# Patient Record
Sex: Female | Born: 1965 | Race: White | Hispanic: No | Smoking: Never smoker
Health system: Southern US, Community
[De-identification: ages and names within clinical notes are randomized; demographics above are authoritative.]

## PROBLEM LIST (undated history)

## (undated) DIAGNOSIS — M7710 Lateral epicondylitis, unspecified elbow: Secondary | ICD-10-CM

## (undated) HISTORY — PX: DIAGNOSTIC LAPAROSCOPY: SUR761

## (undated) HISTORY — PX: NASAL SINUS SURGERY: SHX719

---

## 2006-03-23 ENCOUNTER — Ambulatory Visit: Payer: Self-pay | Admitting: Obstetrics and Gynecology

## 2006-07-12 ENCOUNTER — Ambulatory Visit: Payer: Self-pay | Admitting: Internal Medicine

## 2009-05-16 ENCOUNTER — Ambulatory Visit: Payer: Self-pay | Admitting: Obstetrics and Gynecology

## 2010-05-19 ENCOUNTER — Ambulatory Visit: Payer: Self-pay | Admitting: Obstetrics and Gynecology

## 2010-07-16 ENCOUNTER — Ambulatory Visit: Payer: Self-pay | Admitting: Internal Medicine

## 2010-07-31 ENCOUNTER — Encounter: Payer: Self-pay | Admitting: Internal Medicine

## 2010-08-19 ENCOUNTER — Encounter: Payer: Self-pay | Admitting: Internal Medicine

## 2011-06-01 ENCOUNTER — Ambulatory Visit: Payer: Self-pay | Admitting: Obstetrics and Gynecology

## 2012-02-18 ENCOUNTER — Other Ambulatory Visit: Payer: Self-pay | Admitting: Physician Assistant

## 2012-02-18 LAB — COMPREHENSIVE METABOLIC PANEL
Albumin: 3.7 g/dL (ref 3.4–5.0)
BUN: 11 mg/dL (ref 7–18)
Bilirubin,Total: 0.4 mg/dL (ref 0.2–1.0)
Co2: 27 mmol/L (ref 21–32)
Creatinine: 0.78 mg/dL (ref 0.60–1.30)
EGFR (Non-African Amer.): >60
Glucose: 81 mg/dL (ref 65–99)
Osmolality: 276 (ref 275–301)
Potassium: 3.7 mmol/L (ref 3.5–5.1)
Sodium: 139 mmol/L (ref 136–145)
Total Protein: 7.4 g/dL (ref 6.4–8.2)

## 2012-02-18 LAB — CBC WITH DIFFERENTIAL/PLATELET
Eosinophil #: 0.2 10*3/uL (ref 0.0–0.7)
HGB: 14 g/dL (ref 12.0–16.0)
Lymphocyte #: 1.9 10*3/uL (ref 1.0–3.6)
MCH: 29.3 pg (ref 26.0–34.0)
MCHC: 33.8 g/dL (ref 32.0–36.0)
MCV: 87 fL (ref 80–100)
Monocyte #: 0.4 x10 3/mm (ref 0.2–0.9)
Neutrophil #: 3.3 10*3/uL (ref 1.4–6.5)
Neutrophil %: 57.5 %
RDW: 13 % (ref 11.5–14.5)
WBC: 5.7 10*3/uL (ref 3.6–11.0)

## 2012-02-18 LAB — HEMOGLOBIN A1C: Hemoglobin A1C: 5 % (ref 4.2–6.3)

## 2012-06-27 ENCOUNTER — Ambulatory Visit: Payer: Self-pay | Admitting: General Practice

## 2012-07-20 ENCOUNTER — Ambulatory Visit: Payer: Self-pay | Admitting: Obstetrics and Gynecology

## 2013-04-04 ENCOUNTER — Other Ambulatory Visit: Payer: Self-pay | Admitting: Physician Assistant

## 2013-11-24 ENCOUNTER — Ambulatory Visit: Payer: Self-pay | Admitting: Obstetrics and Gynecology

## 2015-01-04 ENCOUNTER — Ambulatory Visit: Payer: Self-pay | Admitting: Obstetrics and Gynecology

## 2015-11-19 ENCOUNTER — Encounter: Payer: Self-pay | Admitting: Physician Assistant

## 2015-11-19 ENCOUNTER — Ambulatory Visit: Payer: Self-pay | Admitting: Physician Assistant

## 2015-11-19 VITALS — BP 130/94 | HR 100 | Temp 98.7°F

## 2015-11-19 DIAGNOSIS — J209 Acute bronchitis, unspecified: Secondary | ICD-10-CM

## 2015-11-19 MED ORDER — LEVOFLOXACIN 500 MG PO TABS: 500.0000 mg | ORAL_TABLET | Freq: Every day | ORAL | Status: AC

## 2015-11-19 NOTE — Progress Notes (Signed)
S: C/o cough and congestion for 4 days, chest is sore from coughing, some fever, chills, mucus is green, feels like glass in back of her throat, some facial and dental pain,   denies cardiac type chest pain or sob, v/d, abd pain Remainder ros neg  O: vitals wnl, nad, tms clear, throat injected, neck supple no lymph, lungs c t a, cv rrr, neuro intact, cough is tight and congested  A:  Acute bronchitis   P:  rx medication:  levaquin 500mg  qd x 10d, ;  use otc meds, tylenol or motrin as needed for fever/chills, return if not better in 3 -5 days, return earlier if worsening

## 2015-12-02 ENCOUNTER — Other Ambulatory Visit: Payer: Self-pay | Admitting: Obstetrics and Gynecology

## 2015-12-02 DIAGNOSIS — Z01419 Encounter for gynecological examination (general) (routine) without abnormal findings: Secondary | ICD-10-CM | POA: Diagnosis not present

## 2015-12-02 DIAGNOSIS — Z1211 Encounter for screening for malignant neoplasm of colon: Secondary | ICD-10-CM | POA: Diagnosis not present

## 2015-12-02 DIAGNOSIS — Z1231 Encounter for screening mammogram for malignant neoplasm of breast: Secondary | ICD-10-CM | POA: Diagnosis not present

## 2015-12-16 ENCOUNTER — Other Ambulatory Visit
Admission: RE | Admit: 2015-12-16 | Discharge: 2015-12-16 | Disposition: A | Discharge status: Home / Self Care | Payer: 59 | Source: Ambulatory Visit | Attending: Obstetrics and Gynecology | Admitting: Obstetrics and Gynecology

## 2015-12-16 DIAGNOSIS — Z87898 Personal history of other specified conditions: Secondary | ICD-10-CM | POA: Diagnosis not present

## 2015-12-16 DIAGNOSIS — D259 Leiomyoma of uterus, unspecified: Secondary | ICD-10-CM | POA: Diagnosis not present

## 2015-12-16 DIAGNOSIS — E669 Obesity, unspecified: Secondary | ICD-10-CM | POA: Insufficient documentation

## 2015-12-16 LAB — CBC WITH DIFFERENTIAL/PLATELET
BASOS ABS: 0 10*3/uL (ref 0–0.1)
BASOS PCT: 0 %
EOS PCT: 3 %
Eosinophils Absolute: 0.2 10*3/uL (ref 0–0.7)
HCT: 41.8 % (ref 35.0–47.0)
Hemoglobin: 14.4 g/dL (ref 12.0–16.0)
Lymphocytes Relative: 32 %
Lymphs Abs: 2 10*3/uL (ref 1.0–3.6)
MCH: 29 pg (ref 26.0–34.0)
MCHC: 34.4 g/dL (ref 32.0–36.0)
MCV: 84.4 fL (ref 80.0–100.0)
MONO ABS: 0.4 10*3/uL (ref 0.2–0.9)
Monocytes Relative: 6 %
Neutro Abs: 3.6 10*3/uL (ref 1.4–6.5)
Neutrophils Relative %: 59 %
PLATELETS: 239 10*3/uL (ref 150–440)
RBC: 4.95 MIL/uL (ref 3.80–5.20)
RDW: 13 % (ref 11.5–14.5)
WBC: 6.1 10*3/uL (ref 3.6–11.0)

## 2015-12-16 LAB — BASIC METABOLIC PANEL
Anion gap: 5 (ref 5–15)
BUN: 13 mg/dL (ref 6–20)
CALCIUM: 9 mg/dL (ref 8.9–10.3)
CO2: 29 mmol/L (ref 22–32)
Chloride: 107 mmol/L (ref 101–111)
Creatinine, Ser: 0.88 mg/dL (ref 0.44–1.00)
GFR calc Af Amer: >60 mL/min (ref >60)
GLUCOSE: 90 mg/dL (ref 65–99)
POTASSIUM: 4 mmol/L (ref 3.5–5.1)
SODIUM: 141 mmol/L (ref 135–145)

## 2015-12-16 LAB — TSH: TSH: 2.521 u[IU]/mL (ref 0.350–4.500)

## 2015-12-16 LAB — VITAMIN B12: VITAMIN B 12: 270 pg/mL (ref 180–914)

## 2015-12-17 ENCOUNTER — Other Ambulatory Visit: Payer: Self-pay | Admitting: Obstetrics and Gynecology

## 2015-12-17 DIAGNOSIS — Z1231 Encounter for screening mammogram for malignant neoplasm of breast: Secondary | ICD-10-CM

## 2016-01-07 ENCOUNTER — Other Ambulatory Visit: Payer: Self-pay | Admitting: Obstetrics and Gynecology

## 2016-01-07 ENCOUNTER — Ambulatory Visit
Admission: RE | Admit: 2016-01-07 | Discharge: 2016-01-07 | Disposition: A | Discharge status: Home / Self Care | Payer: 59 | Source: Ambulatory Visit | Attending: Obstetrics and Gynecology | Admitting: Obstetrics and Gynecology

## 2016-01-07 DIAGNOSIS — Z1231 Encounter for screening mammogram for malignant neoplasm of breast: Secondary | ICD-10-CM | POA: Diagnosis not present

## 2016-03-13 DIAGNOSIS — H5213 Myopia, bilateral: Secondary | ICD-10-CM | POA: Diagnosis not present

## 2016-06-09 IMAGING — MG MM SCREENING BREAST TOMO BILATERAL
9 of 12 series · 9 of 28 positions shown · non-contrast
Comparison: Previous exam(s).

CLINICAL DATA: Screening.

EXAM:
2D DIGITAL SCREENING BILATERAL MAMMOGRAM WITH CAD AND ADJUNCT TOMO

[L MLO synth-2D]
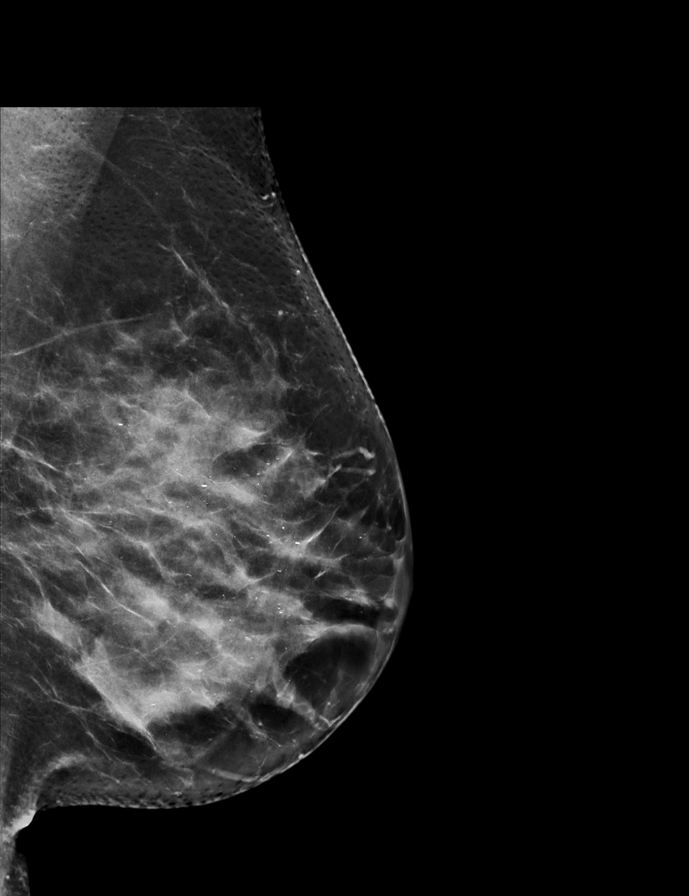

[L CC]
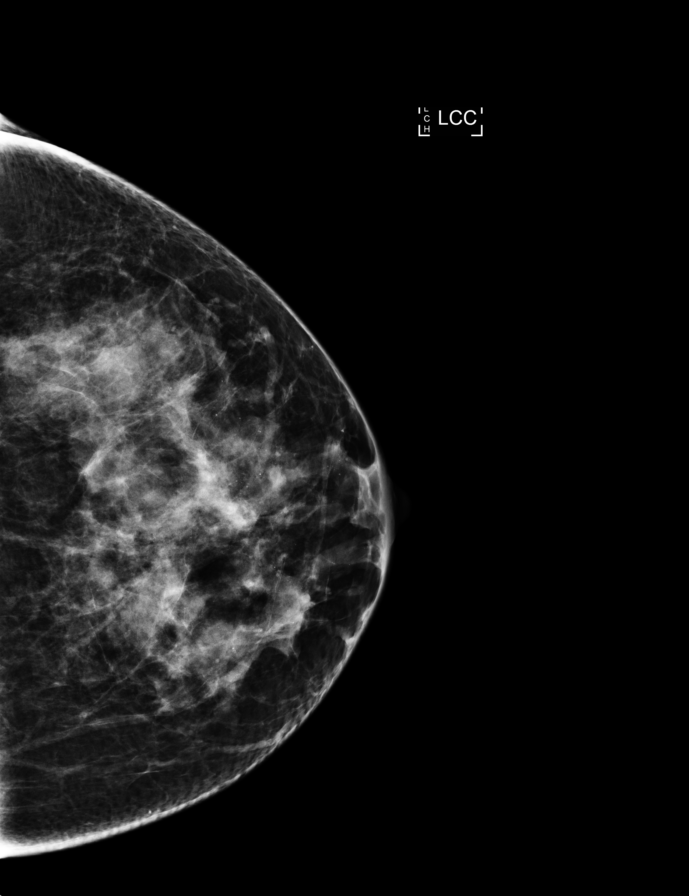

[R MLO synth-2D]
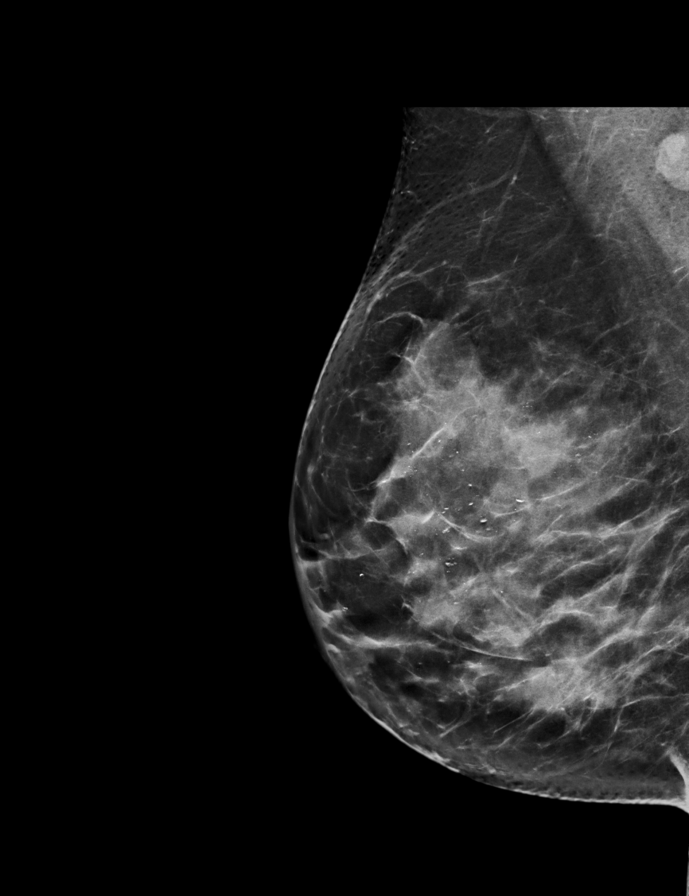

[R MLO]
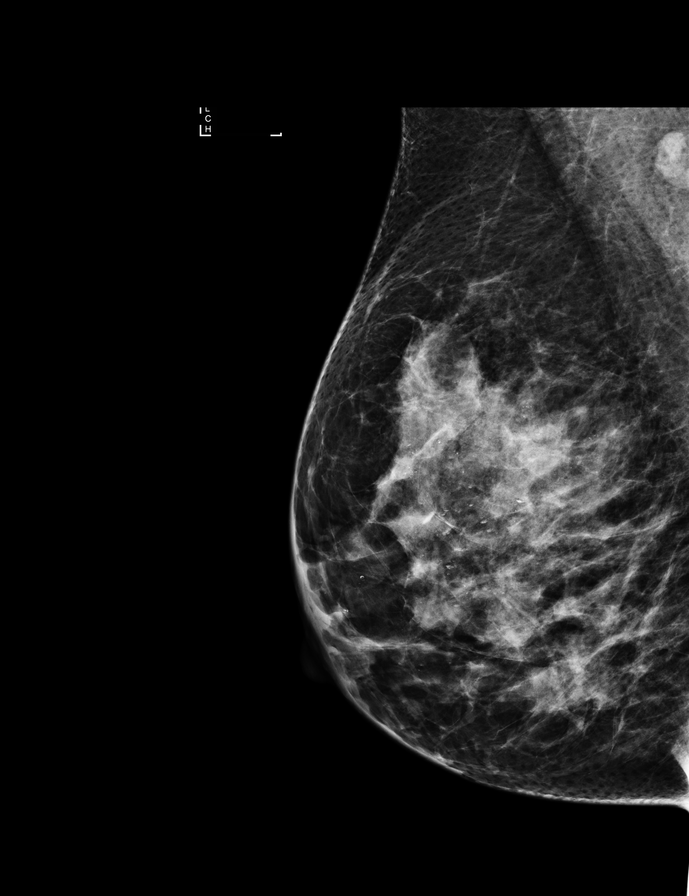

[L CC synth-2D]
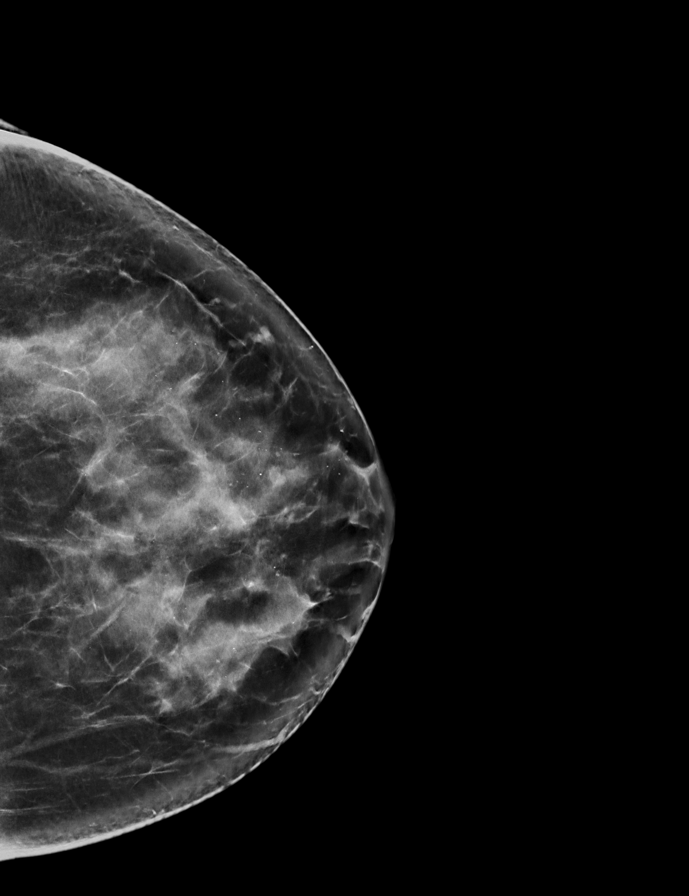

[L MLO]
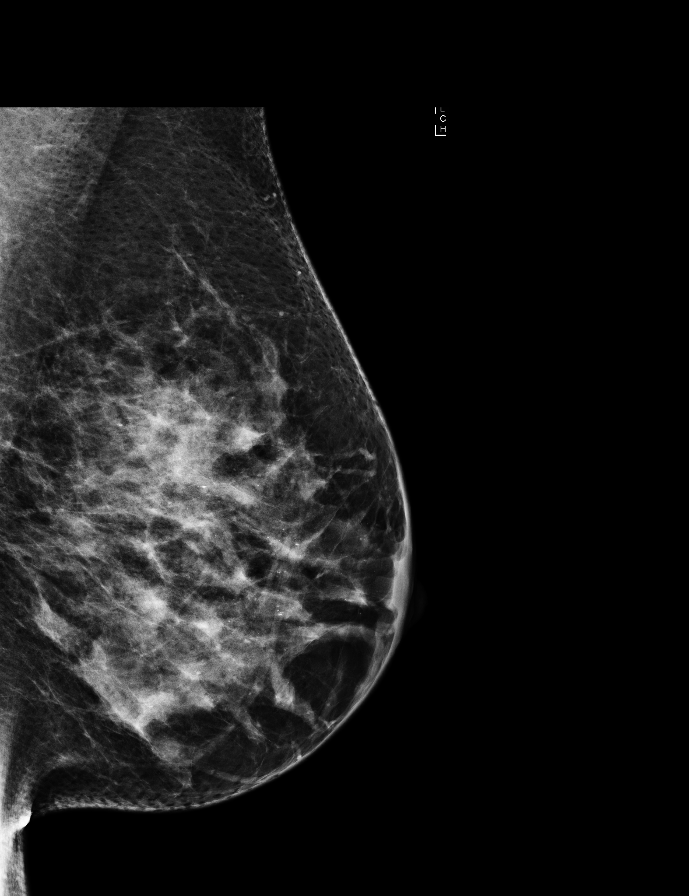

[R CC synth-2D]
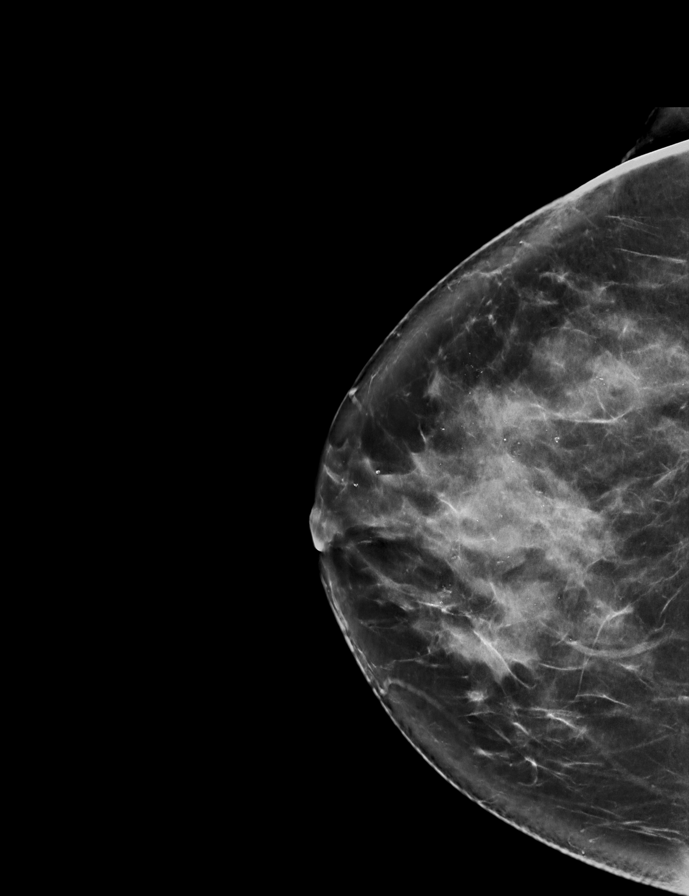

[R CC]
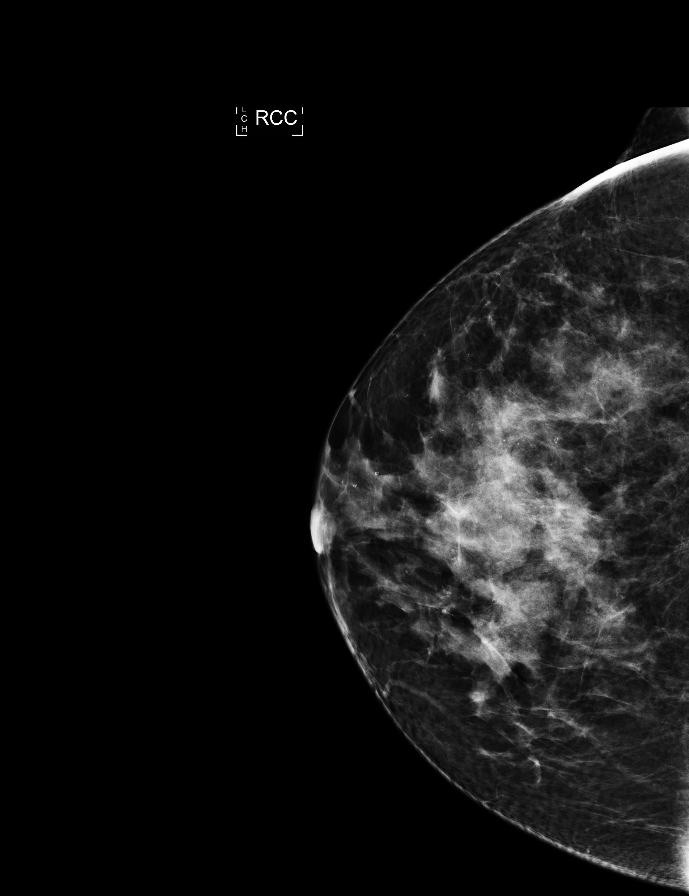

[R CC tomo · tomo slice 43/84.0]
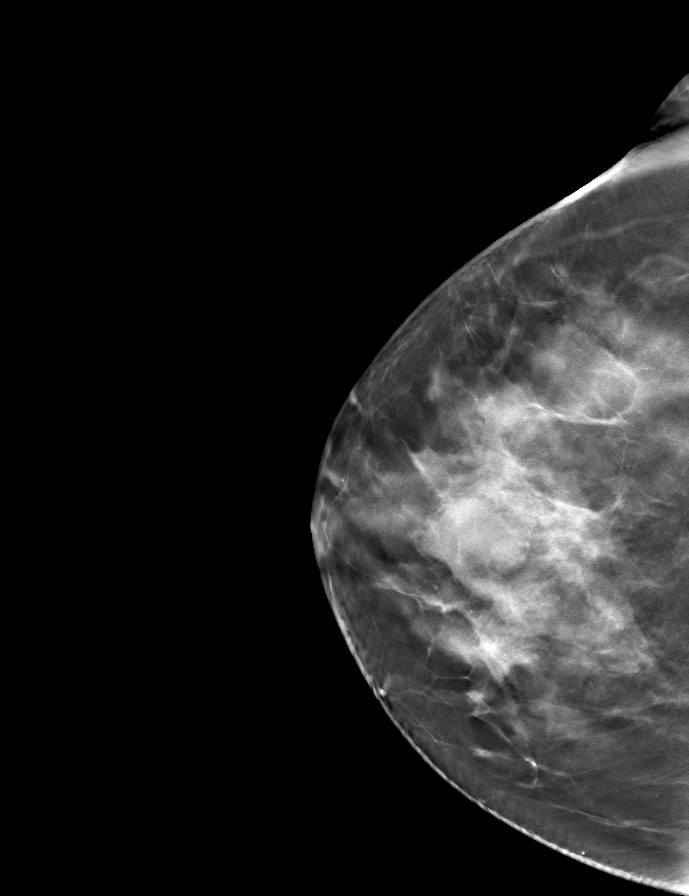

[9 of 28 positions shown; findings below may reference images not displayed]

ACR Breast Density Category c: The breast tissue is heterogeneously
dense, which may obscure small masses.
FINDINGS: There are no findings suspicious for malignancy. Images were
processed with CAD.
IMPRESSION: No mammographic evidence of malignancy. A result letter of this
screening mammogram will be mailed directly to the patient.

RECOMMENDATION:
Screening mammogram in one year. (Code:[id number])

BI-RADS CATEGORY  1: Negative.

## 2016-12-15 DIAGNOSIS — Z124 Encounter for screening for malignant neoplasm of cervix: Secondary | ICD-10-CM | POA: Diagnosis not present

## 2016-12-15 DIAGNOSIS — R87810 Cervical high risk human papillomavirus (HPV) DNA test positive: Secondary | ICD-10-CM | POA: Diagnosis not present

## 2016-12-15 DIAGNOSIS — Z1211 Encounter for screening for malignant neoplasm of colon: Secondary | ICD-10-CM | POA: Diagnosis not present

## 2016-12-15 DIAGNOSIS — Z01419 Encounter for gynecological examination (general) (routine) without abnormal findings: Secondary | ICD-10-CM | POA: Diagnosis not present

## 2016-12-15 DIAGNOSIS — Z1231 Encounter for screening mammogram for malignant neoplasm of breast: Secondary | ICD-10-CM | POA: Diagnosis not present

## 2016-12-23 ENCOUNTER — Other Ambulatory Visit: Payer: Self-pay | Admitting: Obstetrics and Gynecology

## 2016-12-23 DIAGNOSIS — Z1231 Encounter for screening mammogram for malignant neoplasm of breast: Secondary | ICD-10-CM

## 2017-01-25 ENCOUNTER — Encounter: Payer: Self-pay | Admitting: Physician Assistant

## 2017-01-25 ENCOUNTER — Ambulatory Visit
Admission: RE | Admit: 2017-01-25 | Discharge: 2017-01-25 | Disposition: A | Discharge status: Home / Self Care | Payer: 59 | Source: Ambulatory Visit | Attending: Obstetrics and Gynecology | Admitting: Obstetrics and Gynecology

## 2017-01-25 ENCOUNTER — Ambulatory Visit: Payer: Self-pay | Admitting: Physician Assistant

## 2017-01-25 VITALS — BP 120/90 | Temp 98.4°F

## 2017-01-25 DIAGNOSIS — Z1231 Encounter for screening mammogram for malignant neoplasm of breast: Secondary | ICD-10-CM | POA: Insufficient documentation

## 2017-01-25 DIAGNOSIS — L232 Allergic contact dermatitis due to cosmetics: Secondary | ICD-10-CM

## 2017-01-25 DIAGNOSIS — L739 Follicular disorder, unspecified: Secondary | ICD-10-CM

## 2017-01-25 MED ORDER — DEXAMETHASONE SODIUM PHOSPHATE 10 MG/ML IJ SOLN
10.0000 mg | Freq: Once | INTRAMUSCULAR | Status: AC
  Administered 2017-01-25: 10 mg via INTRAMUSCULAR

## 2017-01-25 MED ORDER — DOXYCYCLINE HYCLATE 100 MG PO TABS: 100.0000 mg | ORAL_TABLET | Freq: Two times a day (BID) | ORAL | 0 refills | Status: AC

## 2017-01-25 NOTE — Progress Notes (Signed)
  S: c/o itchy raw scalp, used a hair product she used in the past without difficulty but now area is red, itchy, and sore, also her r upper lid is swollen, no fever/chills  O: vitals wnl, nad, scalp with red areas at base of follicles, no scabbing or drainage, no lice or nits noted, r upper eyelid with slight swelling in medial aspect, neck supple + small nodes posterior cervical chain, n/v intact  A: allergic reaction to shampoo, folliculitis  P: decadron 10mg  Im, doxy

## 2017-12-28 DIAGNOSIS — R87810 Cervical high risk human papillomavirus (HPV) DNA test positive: Secondary | ICD-10-CM | POA: Diagnosis not present

## 2017-12-28 DIAGNOSIS — Z124 Encounter for screening for malignant neoplasm of cervix: Secondary | ICD-10-CM | POA: Diagnosis not present

## 2017-12-28 DIAGNOSIS — Z1231 Encounter for screening mammogram for malignant neoplasm of breast: Secondary | ICD-10-CM | POA: Diagnosis not present

## 2017-12-28 DIAGNOSIS — Z1211 Encounter for screening for malignant neoplasm of colon: Secondary | ICD-10-CM | POA: Diagnosis not present

## 2017-12-28 DIAGNOSIS — Z01419 Encounter for gynecological examination (general) (routine) without abnormal findings: Secondary | ICD-10-CM | POA: Diagnosis not present

## 2018-01-07 ENCOUNTER — Other Ambulatory Visit: Payer: Self-pay | Admitting: Obstetrics and Gynecology

## 2018-01-07 ENCOUNTER — Other Ambulatory Visit
Admission: RE | Admit: 2018-01-07 | Discharge: 2018-01-07 | Disposition: A | Discharge status: Home / Self Care | Payer: 59 | Source: Ambulatory Visit | Attending: Obstetrics and Gynecology | Admitting: Obstetrics and Gynecology

## 2018-01-07 DIAGNOSIS — Z01419 Encounter for gynecological examination (general) (routine) without abnormal findings: Secondary | ICD-10-CM | POA: Diagnosis not present

## 2018-01-07 DIAGNOSIS — Z1231 Encounter for screening mammogram for malignant neoplasm of breast: Secondary | ICD-10-CM

## 2018-01-07 LAB — CBC WITH DIFFERENTIAL/PLATELET
Basophils Absolute: 0 10*3/uL (ref 0–0.1)
Basophils Relative: 0 %
EOS PCT: 2 %
Eosinophils Absolute: 0.1 10*3/uL (ref 0–0.7)
HEMATOCRIT: 45.4 % (ref 35.0–47.0)
Hemoglobin: 15.3 g/dL (ref 12.0–16.0)
LYMPHS ABS: 2.4 10*3/uL (ref 1.0–3.6)
LYMPHS PCT: 31 %
MCH: 28.7 pg (ref 26.0–34.0)
MCHC: 33.6 g/dL (ref 32.0–36.0)
MCV: 85.5 fL (ref 80.0–100.0)
MONO ABS: 0.5 10*3/uL (ref 0.2–0.9)
Monocytes Relative: 7 %
Neutro Abs: 4.7 10*3/uL (ref 1.4–6.5)
Neutrophils Relative %: 60 %
Platelets: 279 10*3/uL (ref 150–440)
RBC: 5.31 MIL/uL — AB (ref 3.80–5.20)
RDW: 13 % (ref 11.5–14.5)
WBC: 7.7 10*3/uL (ref 3.6–11.0)

## 2018-01-07 LAB — URINALYSIS, COMPLETE (UACMP) WITH MICROSCOPIC
BACTERIA UA: NONE SEEN
Bilirubin Urine: NEGATIVE
Glucose, UA: NEGATIVE mg/dL
Ketones, ur: NEGATIVE mg/dL
Leukocytes, UA: NEGATIVE
Nitrite: NEGATIVE
PROTEIN: NEGATIVE mg/dL
Specific Gravity, Urine: 1.018 (ref 1.005–1.030)
pH: 5 (ref 5.0–8.0)

## 2018-01-07 LAB — LIPID PANEL
Cholesterol: 191 mg/dL (ref 0–200)
HDL: 47 mg/dL (ref >40)
LDL Cholesterol: 128 mg/dL — ABNORMAL HIGH (ref 0–99)
TRIGLYCERIDES: 80 mg/dL (ref <150)
Total CHOL/HDL Ratio: 4.1 RATIO
VLDL: 16 mg/dL (ref 0–40)

## 2018-01-07 LAB — BASIC METABOLIC PANEL
Anion gap: 8 (ref 5–15)
BUN: 14 mg/dL (ref 6–20)
CO2: 24 mmol/L (ref 22–32)
Calcium: 9.2 mg/dL (ref 8.9–10.3)
Chloride: 106 mmol/L (ref 101–111)
Creatinine, Ser: 0.74 mg/dL (ref 0.44–1.00)
GFR calc Af Amer: >60 mL/min (ref >60)
GLUCOSE: 89 mg/dL (ref 65–99)
POTASSIUM: 4 mmol/L (ref 3.5–5.1)
Sodium: 138 mmol/L (ref 135–145)

## 2018-01-26 ENCOUNTER — Ambulatory Visit
Admission: RE | Admit: 2018-01-26 | Discharge: 2018-01-26 | Disposition: A | Discharge status: Home / Self Care | Payer: 59 | Source: Ambulatory Visit | Attending: Obstetrics and Gynecology | Admitting: Obstetrics and Gynecology

## 2018-01-26 DIAGNOSIS — Z1231 Encounter for screening mammogram for malignant neoplasm of breast: Secondary | ICD-10-CM | POA: Diagnosis not present

## 2019-01-11 DIAGNOSIS — Z01419 Encounter for gynecological examination (general) (routine) without abnormal findings: Secondary | ICD-10-CM | POA: Diagnosis not present

## 2019-01-11 DIAGNOSIS — Z124 Encounter for screening for malignant neoplasm of cervix: Secondary | ICD-10-CM | POA: Diagnosis not present

## 2019-01-11 DIAGNOSIS — Z1211 Encounter for screening for malignant neoplasm of colon: Secondary | ICD-10-CM | POA: Diagnosis not present

## 2019-01-11 DIAGNOSIS — Z1239 Encounter for other screening for malignant neoplasm of breast: Secondary | ICD-10-CM | POA: Diagnosis not present

## 2019-01-24 DIAGNOSIS — N72 Inflammatory disease of cervix uteri: Secondary | ICD-10-CM | POA: Diagnosis not present

## 2019-01-24 DIAGNOSIS — R87810 Cervical high risk human papillomavirus (HPV) DNA test positive: Secondary | ICD-10-CM | POA: Diagnosis not present

## 2019-01-24 DIAGNOSIS — B977 Papillomavirus as the cause of diseases classified elsewhere: Secondary | ICD-10-CM | POA: Diagnosis not present

## 2019-03-24 ENCOUNTER — Other Ambulatory Visit: Payer: Self-pay | Admitting: Obstetrics and Gynecology

## 2019-03-24 ENCOUNTER — Other Ambulatory Visit
Admission: RE | Admit: 2019-03-24 | Discharge: 2019-03-24 | Disposition: A | Discharge status: Home / Self Care | Payer: 59 | Source: Ambulatory Visit | Attending: Obstetrics and Gynecology | Admitting: Obstetrics and Gynecology

## 2019-03-24 DIAGNOSIS — Z1231 Encounter for screening mammogram for malignant neoplasm of breast: Secondary | ICD-10-CM

## 2019-03-24 DIAGNOSIS — Z01419 Encounter for gynecological examination (general) (routine) without abnormal findings: Secondary | ICD-10-CM | POA: Insufficient documentation

## 2019-03-24 LAB — BASIC METABOLIC PANEL
Anion gap: 8 (ref 5–15)
BUN: 25 mg/dL — ABNORMAL HIGH (ref 6–20)
CO2: 25 mmol/L (ref 22–32)
Calcium: 9 mg/dL (ref 8.9–10.3)
Chloride: 106 mmol/L (ref 98–111)
Creatinine, Ser: 0.91 mg/dL (ref 0.44–1.00)
GFR calc Af Amer: >60 mL/min (ref >60)
GFR calc non Af Amer: >60 mL/min (ref >60)
Glucose, Bld: 96 mg/dL (ref 70–99)
Potassium: 4.4 mmol/L (ref 3.5–5.1)
Sodium: 139 mmol/L (ref 135–145)

## 2019-03-24 LAB — CBC WITH DIFFERENTIAL/PLATELET
Abs Immature Granulocytes: 0.01 10*3/uL (ref 0.00–0.07)
Basophils Absolute: 0 10*3/uL (ref 0.0–0.1)
Basophils Relative: 0 %
Eosinophils Absolute: 0.2 10*3/uL (ref 0.0–0.5)
Eosinophils Relative: 3 %
HCT: 43.9 % (ref 36.0–46.0)
Hemoglobin: 14.5 g/dL (ref 12.0–15.0)
Immature Granulocytes: 0 %
Lymphocytes Relative: 39 %
Lymphs Abs: 2.3 10*3/uL (ref 0.7–4.0)
MCH: 29 pg (ref 26.0–34.0)
MCHC: 33 g/dL (ref 30.0–36.0)
MCV: 87.8 fL (ref 80.0–100.0)
Monocytes Absolute: 0.4 10*3/uL (ref 0.1–1.0)
Monocytes Relative: 6 %
Neutro Abs: 3 10*3/uL (ref 1.7–7.7)
Neutrophils Relative %: 52 %
Platelets: 302 10*3/uL (ref 150–400)
RBC: 5 MIL/uL (ref 3.87–5.11)
RDW: 12 % (ref 11.5–15.5)
WBC: 5.8 10*3/uL (ref 4.0–10.5)
nRBC: 0 % (ref 0.0–0.2)

## 2019-03-24 LAB — LIPID PANEL
Cholesterol: 215 mg/dL — ABNORMAL HIGH (ref 0–200)
HDL: 51 mg/dL (ref >40)
LDL Cholesterol: 154 mg/dL — ABNORMAL HIGH (ref 0–99)
Total CHOL/HDL Ratio: 4.2 RATIO
Triglycerides: 51 mg/dL (ref <150)
VLDL: 10 mg/dL (ref 0–40)

## 2019-04-05 DIAGNOSIS — Z1211 Encounter for screening for malignant neoplasm of colon: Secondary | ICD-10-CM | POA: Diagnosis not present

## 2019-05-01 DIAGNOSIS — Z01818 Encounter for other preprocedural examination: Secondary | ICD-10-CM | POA: Diagnosis not present

## 2019-05-01 DIAGNOSIS — Z8371 Family history of colonic polyps: Secondary | ICD-10-CM | POA: Diagnosis not present

## 2019-05-01 DIAGNOSIS — Z1211 Encounter for screening for malignant neoplasm of colon: Secondary | ICD-10-CM | POA: Diagnosis not present

## 2019-05-09 ENCOUNTER — Other Ambulatory Visit: Payer: Self-pay

## 2019-05-09 ENCOUNTER — Ambulatory Visit
Admission: RE | Admit: 2019-05-09 | Discharge: 2019-05-09 | Disposition: A | Discharge status: Home / Self Care | Payer: 59 | Source: Ambulatory Visit | Attending: Obstetrics and Gynecology | Admitting: Obstetrics and Gynecology

## 2019-05-09 DIAGNOSIS — Z1231 Encounter for screening mammogram for malignant neoplasm of breast: Secondary | ICD-10-CM | POA: Diagnosis not present

## 2019-08-31 ENCOUNTER — Other Ambulatory Visit
Admission: RE | Admit: 2019-08-31 | Discharge: 2019-08-31 | Disposition: A | Discharge status: Home / Self Care | Payer: 59 | Source: Ambulatory Visit | Attending: Internal Medicine | Admitting: Internal Medicine

## 2019-08-31 ENCOUNTER — Other Ambulatory Visit: Payer: Self-pay

## 2019-08-31 DIAGNOSIS — Z20828 Contact with and (suspected) exposure to other viral communicable diseases: Secondary | ICD-10-CM | POA: Diagnosis not present

## 2019-08-31 DIAGNOSIS — Z01812 Encounter for preprocedural laboratory examination: Secondary | ICD-10-CM | POA: Insufficient documentation

## 2019-08-31 LAB — SARS CORONAVIRUS 2 (TAT 6-24 HRS): SARS Coronavirus 2: NEGATIVE

## 2019-09-04 ENCOUNTER — Ambulatory Visit: Payer: 59 | Admitting: Certified Registered Nurse Anesthetist

## 2019-09-04 ENCOUNTER — Other Ambulatory Visit: Payer: Self-pay

## 2019-09-04 ENCOUNTER — Encounter
Admission: RE | Disposition: A | Discharge status: Home / Self Care | Payer: Self-pay | Source: Home / Self Care | Attending: Internal Medicine

## 2019-09-04 ENCOUNTER — Encounter: Payer: Self-pay | Admitting: Certified Registered Nurse Anesthetist

## 2019-09-04 ENCOUNTER — Ambulatory Visit
Admission: RE | Admit: 2019-09-04 | Discharge: 2019-09-04 | Disposition: A | Discharge status: Home / Self Care | Payer: 59 | Attending: Internal Medicine | Admitting: Internal Medicine

## 2019-09-04 DIAGNOSIS — Z8371 Family history of colonic polyps: Secondary | ICD-10-CM | POA: Insufficient documentation

## 2019-09-04 DIAGNOSIS — K5790 Diverticulosis of intestine, part unspecified, without perforation or abscess without bleeding: Secondary | ICD-10-CM | POA: Diagnosis not present

## 2019-09-04 DIAGNOSIS — Z79899 Other long term (current) drug therapy: Secondary | ICD-10-CM | POA: Insufficient documentation

## 2019-09-04 DIAGNOSIS — K621 Rectal polyp: Secondary | ICD-10-CM | POA: Insufficient documentation

## 2019-09-04 DIAGNOSIS — K635 Polyp of colon: Secondary | ICD-10-CM | POA: Diagnosis not present

## 2019-09-04 DIAGNOSIS — Z881 Allergy status to other antibiotic agents status: Secondary | ICD-10-CM | POA: Diagnosis not present

## 2019-09-04 DIAGNOSIS — Z1211 Encounter for screening for malignant neoplasm of colon: Secondary | ICD-10-CM | POA: Insufficient documentation

## 2019-09-04 DIAGNOSIS — Z882 Allergy status to sulfonamides status: Secondary | ICD-10-CM | POA: Diagnosis not present

## 2019-09-04 DIAGNOSIS — K5730 Diverticulosis of large intestine without perforation or abscess without bleeding: Secondary | ICD-10-CM | POA: Insufficient documentation

## 2019-09-04 DIAGNOSIS — D128 Benign neoplasm of rectum: Secondary | ICD-10-CM | POA: Diagnosis not present

## 2019-09-04 HISTORY — PX: COLONOSCOPY WITH PROPOFOL: SHX5780

## 2019-09-04 LAB — POCT PREGNANCY, URINE: Preg Test, Ur: NEGATIVE

## 2019-09-04 SURGERY — COLONOSCOPY WITH PROPOFOL
Anesthesia: General

## 2019-09-04 MED ORDER — LIDOCAINE HCL (PF) 2 % IJ SOLN
INTRAMUSCULAR | Status: AC
  Filled 2019-09-04: qty 10

## 2019-09-04 MED ORDER — SODIUM CHLORIDE 0.9 % IV SOLN
INTRAVENOUS | Status: DC
  Administered 2019-09-04: 09:00:00 1000 mL via INTRAVENOUS

## 2019-09-04 MED ORDER — PROPOFOL 500 MG/50ML IV EMUL
INTRAVENOUS | Status: AC
  Filled 2019-09-04: qty 50

## 2019-09-04 MED ORDER — PROPOFOL 10 MG/ML IV BOLUS
INTRAVENOUS | Status: DC | PRN
  Administered 2019-09-04: 80 mg via INTRAVENOUS
  Administered 2019-09-04: 15 mg via INTRAVENOUS

## 2019-09-04 MED ORDER — LIDOCAINE HCL (CARDIAC) PF 100 MG/5ML IV SOSY
PREFILLED_SYRINGE | INTRAVENOUS | Status: DC | PRN
  Administered 2019-09-04: 50 mg via INTRAVENOUS

## 2019-09-04 MED ORDER — PROPOFOL 500 MG/50ML IV EMUL
INTRAVENOUS | Status: DC | PRN
  Administered 2019-09-04: 130 ug/kg/min via INTRAVENOUS

## 2019-09-04 NOTE — Transfer of Care (Signed)
Immediate Anesthesia Transfer of Care Note  Patient: Paula Sanders  Procedure(s) Performed: COLONOSCOPY WITH PROPOFOL (N/A )  Patient Location: PACU and Endoscopy Unit  Anesthesia Type:General  Level of Consciousness: awake, alert , oriented and patient cooperative  Airway & Oxygen Therapy: Patient Spontanous Breathing  Post-op Assessment: Report given to RN and Post -op Vital signs reviewed and stable  Post vital signs: Reviewed and stable  Last Vitals:  Vitals Value Taken Time  BP 91/54 09/04/19 0948  Temp    Pulse 89 09/04/19 0948  Resp 24 09/04/19 0948  SpO2 100 % 09/04/19 0948  Vitals shown include unvalidated device data.  Last Pain:  Vitals:   09/04/19 0829  TempSrc: Temporal  PainSc: 0-No pain         Complications: No apparent anesthesia complications

## 2019-09-04 NOTE — Anesthesia Postprocedure Evaluation (Signed)
Anesthesia Post Note  Patient: Paula Sanders  Procedure(s) Performed: COLONOSCOPY WITH PROPOFOL (N/A )  Patient location during evaluation: Endoscopy Anesthesia Type: General Level of consciousness: awake and alert Pain management: pain level controlled Vital Signs Assessment: post-procedure vital signs reviewed and stable Respiratory status: spontaneous breathing, nonlabored ventilation, respiratory function stable and patient connected to nasal cannula oxygen Cardiovascular status: blood pressure returned to baseline and stable Postop Assessment: no apparent nausea or vomiting Anesthetic complications: no     Last Vitals:  Vitals:   09/04/19 0958 09/04/19 1008  BP: 99/68 (!) 104/54  Pulse: 88 83  Resp: 19 13  Temp:    SpO2: 100% 100%    Last Pain:  Vitals:   09/04/19 1008  TempSrc:   PainSc: 0-No pain                 Martha Clan

## 2019-09-04 NOTE — Anesthesia Preprocedure Evaluation (Signed)
Anesthesia Evaluation  Patient identified by MRN, date of birth, ID band Patient awake    Reviewed: Allergy & Precautions, H&P , NPO status , Patient's Chart, lab work & pertinent test results, reviewed documented beta blocker date and time   History of Anesthesia Complications Negative for: history of anesthetic complications  Airway Mallampati: II  TM Distance: >3 FB Neck ROM: full    Dental  (+) Dental Advidsory Given, Teeth Intact   Pulmonary neg pulmonary ROS,    Pulmonary exam normal        Cardiovascular Exercise Tolerance: Good negative cardio ROS Normal cardiovascular exam     Neuro/Psych negative neurological ROS  negative psych ROS   GI/Hepatic negative GI ROS, Neg liver ROS,   Endo/Other  negative endocrine ROS  Renal/GU negative Renal ROS  negative genitourinary   Musculoskeletal   Abdominal   Peds  Hematology negative hematology ROS (+)   Anesthesia Other Findings History reviewed. No pertinent past medical history.   Reproductive/Obstetrics negative OB ROS                             Anesthesia Physical Anesthesia Plan  ASA: I  Anesthesia Plan: General   Post-op Pain Management:    Induction:   PONV Risk Score and Plan: Propofol infusion and TIVA  Airway Management Planned: Natural Airway and Nasal Cannula  Additional Equipment:   Intra-op Plan:   Post-operative Plan:   Informed Consent: I have reviewed the patients History and Physical, chart, labs and discussed the procedure including the risks, benefits and alternatives for the proposed anesthesia with the patient or authorized representative who has indicated his/her understanding and acceptance.     Dental Advisory Given  Plan Discussed with: Anesthesiologist, CRNA and Surgeon  Anesthesia Plan Comments:         Anesthesia Quick Evaluation

## 2019-09-04 NOTE — Anesthesia Post-op Follow-up Note (Signed)
Anesthesia QCDR form completed.        

## 2019-09-04 NOTE — H&P (Signed)
Outpatient short stay form Pre-procedure 09/04/2019 8:26 AM Teodoro K. Alice Reichert, M.D.  Primary Physician: N/A  Reason for visit:  Colon cancer screening  History of present illness:  Patient presents for colonoscopy for colon cancer screening. The patient denies complaints of abdominal pain, significant change in bowel habits, or rectal bleeding.    No current facility-administered medications for this encounter.   Medications Prior to Admission  Medication Sig Dispense Refill Last Dose  . Biotin 1 MG CAPS Take by mouth 3 (three) times daily.   09/03/2019 at Unknown time  . Multiple Vitamin (MULTIVITAMIN) tablet Take 1 tablet by mouth daily.   09/03/2019 at Unknown time  . vitamin B-12 (CYANOCOBALAMIN) 1000 MCG tablet Take 1,000 mcg by mouth daily.   Past Month at Unknown time  . cetirizine (ZYRTEC) 10 MG tablet Take 10 mg by mouth daily.   Not Taking at Unknown time  . doxycycline (VIBRA-TABS) 100 MG tablet Take 1 tablet (100 mg total) by mouth 2 (two) times daily. (Patient not taking: Reported on 09/04/2019) 14 tablet 0 Completed Course at Unknown time  . fluticasone (FLONASE) 50 MCG/ACT nasal spray Place into both nostrils daily.   Not Taking at Unknown time  . levofloxacin (LEVAQUIN) 500 MG tablet Take 1 tablet (500 mg total) by mouth daily. (Patient not taking: Reported on 01/25/2017) 10 tablet 0      Allergies  Allergen Reactions  . Sulfa Antibiotics Rash     History reviewed. No pertinent past medical history.  Review of systems:  Otherwise negative.    Physical Exam  Gen: Alert, oriented. Appears stated age.  HEENT: [place]/AT. PERRLA. Lungs: CTA, no wheezes. CV: RR nl S1, S2. Abd: soft, benign, no masses. BS+ Ext: No edema. Pulses 2+    Planned procedures: Proceed with colonoscopy. The patient understands the nature of the planned procedure, indications, risks, alternatives and potential complications including but not limited to bleeding, infection, perforation, damage  to internal organs and possible oversedation/side effects from anesthesia. The patient agrees and gives consent to proceed.  Please refer to procedure notes for findings, recommendations and patient disposition/instructions.     Teodoro K. Alice Reichert, M.D. Gastroenterology 09/04/2019  8:26 AM

## 2019-09-04 NOTE — Op Note (Signed)
[facility] Gastroenterology Patient Name: Paula Sanders Procedure Date: 09/04/2019 9:26 AM MRN: [id number] Account #: [id number] Date of Birth: April 10, 1966 Admit Type: Outpatient Age: 53 Room: [facility] ENDO ROOM 1 Gender: Female Note Status: Finalized Procedure:             Colonoscopy Indications:           Screening for colorectal malignant neoplasm, Colon                         cancer screening in patient at increased risk: Family                         history of 1st-degree relative with colon polyps Providers:             Benay Pike. Laikynn Pollio MD, MD Medicines:             Propofol per Anesthesia Complications:         No immediate complications. Procedure:             Pre-Anesthesia Assessment:                        - The risks and benefits of the procedure and the                         sedation options and risks were discussed with the                         patient. All questions were answered and informed                         consent was obtained.                        - Patient identification and proposed procedure were                         verified prior to the procedure by the nurse. The                         procedure was verified in the procedure room.                        After obtaining informed consent, the colonoscope was                         passed under direct vision. Throughout the procedure,                         the patient's blood pressure, pulse, and oxygen                         saturations were monitored continuously. The                         Colonoscope was introduced through the anus and                         advanced to the the terminal ileum, with  identification of the appendiceal orifice and IC                         valve. The quality of the bowel preparation was                         adequate. The terminal ileum, ileocecal valve,                         appendiceal orifice, and  rectum were photographed. Findings:      The perianal and digital rectal examinations were normal. Pertinent       negatives include normal sphincter tone and no palpable rectal lesions.      Many small-mouthed diverticula were found in the sigmoid colon.      A 4 mm polyp was found in the rectum. The polyp was sessile. The polyp       was removed with a cold snare. Resection and retrieval were complete.      The exam was otherwise without abnormality on direct and retroflexion       views.      The terminal ileum appeared normal. Impression:            - Diverticulosis in the sigmoid colon.                        - One 4 mm polyp in the rectum, removed with a cold                         snare. Resected and retrieved.                        - The examination was otherwise normal on direct and                         retroflexion views. Recommendation:        - Patient has a contact number available for                         emergencies. The signs and symptoms of potential                         delayed complications were discussed with the patient.                         Return to normal activities tomorrow. Written                         discharge instructions were provided to the patient.                        - Resume previous diet.                        - Continue present medications.                        - Repeat colonoscopy in 5 years for screening purposes.                        -  Return to GI office PRN.                        - The findings and recommendations were discussed with                         the patient. Procedure Code(s):     --- Professional ---                        [phone], Colonoscopy, flexible; with removal of                         tumor(s), polyp(s), or other lesion(s) by snare                         technique Diagnosis Code(s):     --- Professional ---                        K57.30, Diverticulosis of large intestine without                          perforation or abscess without bleeding                        K62.1, Rectal polyp                        Z83.71, Family history of colonic polyps                        Z12.11, Encounter for screening for malignant neoplasm                         of colon CPT copyright 2019 American Medical Association. All rights reserved. The codes documented in this report are preliminary and upon coder review may  be revised to meet current compliance requirements. Efrain Sella MD, MD 09/04/2019 9:50:15 AM This report has been signed electronically. Number of Addenda: 0 Note Initiated On: 09/04/2019 9:26 AM Scope Withdrawal Time: 0 hours 6 minutes 38 seconds  Total Procedure Duration: 0 hours 12 minutes 18 seconds  Estimated Blood Loss:  Estimated blood loss: none. Estimated blood loss:                         none. Estimated blood loss: none.      [facility]

## 2019-09-05 LAB — SURGICAL PATHOLOGY

## 2020-02-15 ENCOUNTER — Telehealth: Payer: 59 | Admitting: Emergency Medicine

## 2020-02-15 DIAGNOSIS — M545 Low back pain, unspecified: Secondary | ICD-10-CM

## 2020-02-15 MED ORDER — NAPROXEN 500 MG PO TABS: 500.0000 mg | ORAL_TABLET | Freq: Two times a day (BID) | ORAL | 0 refills | Status: AC

## 2020-02-15 MED ORDER — BACLOFEN 10 MG PO TABS: 10.0000 mg | ORAL_TABLET | Freq: Three times a day (TID) | ORAL | 0 refills | Status: AC | PRN

## 2020-02-15 NOTE — Progress Notes (Signed)

## 2020-02-21 ENCOUNTER — Other Ambulatory Visit: Payer: Self-pay | Admitting: Obstetrics and Gynecology

## 2020-02-21 DIAGNOSIS — Z124 Encounter for screening for malignant neoplasm of cervix: Secondary | ICD-10-CM | POA: Diagnosis not present

## 2020-02-21 DIAGNOSIS — Z1231 Encounter for screening mammogram for malignant neoplasm of breast: Secondary | ICD-10-CM | POA: Diagnosis not present

## 2020-02-21 DIAGNOSIS — Z01419 Encounter for gynecological examination (general) (routine) without abnormal findings: Secondary | ICD-10-CM | POA: Diagnosis not present

## 2020-02-21 DIAGNOSIS — Z1211 Encounter for screening for malignant neoplasm of colon: Secondary | ICD-10-CM | POA: Diagnosis not present

## 2020-04-09 DIAGNOSIS — Z01419 Encounter for gynecological examination (general) (routine) without abnormal findings: Secondary | ICD-10-CM | POA: Diagnosis not present

## 2020-04-09 DIAGNOSIS — Z1211 Encounter for screening for malignant neoplasm of colon: Secondary | ICD-10-CM | POA: Diagnosis not present

## 2020-05-07 DIAGNOSIS — R87810 Cervical high risk human papillomavirus (HPV) DNA test positive: Secondary | ICD-10-CM | POA: Diagnosis not present

## 2020-05-09 ENCOUNTER — Ambulatory Visit
Admission: RE | Admit: 2020-05-09 | Discharge: 2020-05-09 | Disposition: A | Discharge status: Home / Self Care | Payer: 59 | Source: Ambulatory Visit | Attending: Obstetrics and Gynecology | Admitting: Obstetrics and Gynecology

## 2020-05-09 DIAGNOSIS — Z1231 Encounter for screening mammogram for malignant neoplasm of breast: Secondary | ICD-10-CM | POA: Insufficient documentation

## 2020-05-16 ENCOUNTER — Other Ambulatory Visit: Payer: Self-pay | Admitting: Obstetrics and Gynecology

## 2020-05-16 DIAGNOSIS — R928 Other abnormal and inconclusive findings on diagnostic imaging of breast: Secondary | ICD-10-CM

## 2020-05-16 DIAGNOSIS — N6310 Unspecified lump in the right breast, unspecified quadrant: Secondary | ICD-10-CM

## 2020-05-23 ENCOUNTER — Other Ambulatory Visit: Payer: Self-pay

## 2020-05-23 ENCOUNTER — Ambulatory Visit
Admission: RE | Admit: 2020-05-23 | Discharge: 2020-05-23 | Disposition: A | Discharge status: Home / Self Care | Payer: 59 | Source: Ambulatory Visit | Attending: Obstetrics and Gynecology | Admitting: Obstetrics and Gynecology

## 2020-05-23 DIAGNOSIS — R928 Other abnormal and inconclusive findings on diagnostic imaging of breast: Secondary | ICD-10-CM | POA: Diagnosis not present

## 2020-05-23 DIAGNOSIS — N6310 Unspecified lump in the right breast, unspecified quadrant: Secondary | ICD-10-CM | POA: Diagnosis not present

## 2020-05-23 DIAGNOSIS — N6311 Unspecified lump in the right breast, upper outer quadrant: Secondary | ICD-10-CM | POA: Diagnosis not present

## 2020-05-29 ENCOUNTER — Other Ambulatory Visit: Payer: Self-pay | Admitting: Obstetrics and Gynecology

## 2020-05-29 DIAGNOSIS — R928 Other abnormal and inconclusive findings on diagnostic imaging of breast: Secondary | ICD-10-CM

## 2020-05-29 DIAGNOSIS — N6310 Unspecified lump in the right breast, unspecified quadrant: Secondary | ICD-10-CM

## 2020-05-30 ENCOUNTER — Ambulatory Visit
Admission: RE | Admit: 2020-05-30 | Discharge: 2020-05-30 | Disposition: A | Discharge status: Home / Self Care | Payer: 59 | Source: Ambulatory Visit | Attending: Obstetrics and Gynecology | Admitting: Obstetrics and Gynecology

## 2020-05-30 ENCOUNTER — Other Ambulatory Visit: Payer: Self-pay

## 2020-05-30 DIAGNOSIS — R928 Other abnormal and inconclusive findings on diagnostic imaging of breast: Secondary | ICD-10-CM

## 2020-05-30 DIAGNOSIS — N6310 Unspecified lump in the right breast, unspecified quadrant: Secondary | ICD-10-CM | POA: Diagnosis not present

## 2020-05-30 DIAGNOSIS — N6311 Unspecified lump in the right breast, upper outer quadrant: Secondary | ICD-10-CM | POA: Diagnosis not present

## 2020-05-30 DIAGNOSIS — N6081 Other benign mammary dysplasias of right breast: Secondary | ICD-10-CM | POA: Diagnosis not present

## 2020-05-30 HISTORY — PX: BREAST BIOPSY: SHX20

## 2020-05-31 LAB — SURGICAL PATHOLOGY

## 2021-02-25 DIAGNOSIS — Z01419 Encounter for gynecological examination (general) (routine) without abnormal findings: Secondary | ICD-10-CM | POA: Diagnosis not present

## 2021-02-25 DIAGNOSIS — Z1239 Encounter for other screening for malignant neoplasm of breast: Secondary | ICD-10-CM | POA: Diagnosis not present

## 2021-02-25 DIAGNOSIS — Z124 Encounter for screening for malignant neoplasm of cervix: Secondary | ICD-10-CM | POA: Diagnosis not present

## 2021-02-25 DIAGNOSIS — R87610 Atypical squamous cells of undetermined significance on cytologic smear of cervix (ASC-US): Secondary | ICD-10-CM | POA: Diagnosis not present

## 2021-02-25 DIAGNOSIS — Z1211 Encounter for screening for malignant neoplasm of colon: Secondary | ICD-10-CM | POA: Diagnosis not present

## 2021-02-26 DIAGNOSIS — Z01419 Encounter for gynecological examination (general) (routine) without abnormal findings: Secondary | ICD-10-CM | POA: Diagnosis not present

## 2021-02-26 DIAGNOSIS — Z13220 Encounter for screening for lipoid disorders: Secondary | ICD-10-CM | POA: Diagnosis not present

## 2021-04-07 DIAGNOSIS — Z1211 Encounter for screening for malignant neoplasm of colon: Secondary | ICD-10-CM | POA: Diagnosis not present

## 2021-05-07 DIAGNOSIS — B977 Papillomavirus as the cause of diseases classified elsewhere: Secondary | ICD-10-CM | POA: Diagnosis not present

## 2021-05-07 DIAGNOSIS — R87810 Cervical high risk human papillomavirus (HPV) DNA test positive: Secondary | ICD-10-CM | POA: Diagnosis not present

## 2021-05-07 DIAGNOSIS — R87610 Atypical squamous cells of undetermined significance on cytologic smear of cervix (ASC-US): Secondary | ICD-10-CM | POA: Diagnosis not present

## 2021-07-01 ENCOUNTER — Other Ambulatory Visit: Payer: Self-pay | Admitting: Obstetrics and Gynecology

## 2021-07-01 DIAGNOSIS — Z1231 Encounter for screening mammogram for malignant neoplasm of breast: Secondary | ICD-10-CM

## 2021-08-11 ENCOUNTER — Other Ambulatory Visit: Payer: Self-pay

## 2021-08-11 ENCOUNTER — Ambulatory Visit
Admission: RE | Admit: 2021-08-11 | Discharge: 2021-08-11 | Disposition: A | Discharge status: Home / Self Care | Payer: 59 | Source: Ambulatory Visit | Attending: Obstetrics and Gynecology | Admitting: Obstetrics and Gynecology

## 2021-08-11 DIAGNOSIS — Z1231 Encounter for screening mammogram for malignant neoplasm of breast: Secondary | ICD-10-CM | POA: Insufficient documentation

## 2021-10-09 ENCOUNTER — Ambulatory Visit: Payer: 59 | Attending: Internal Medicine

## 2021-10-09 ENCOUNTER — Other Ambulatory Visit: Payer: Self-pay

## 2021-10-09 ENCOUNTER — Ambulatory Visit: Payer: 59

## 2021-10-09 DIAGNOSIS — Z23 Encounter for immunization: Secondary | ICD-10-CM

## 2021-10-09 MED ORDER — PFIZER COVID-19 VAC BIVALENT 30 MCG/0.3ML IM SUSP
INTRAMUSCULAR | 0 refills | Status: AC
  Filled 2021-10-09: qty 0.3, 1d supply, fill #0

## 2021-10-09 NOTE — Progress Notes (Signed)
° °  Covid-19 Vaccination Clinic  Name:  Paula Sanders    MRN: [id number] DOB: November 29, 1965  10/09/2021  [name] was observed post Covid-19 immunization for 15 minutes without incident. She was provided with Vaccine Information Sheet and instruction to access the V-Safe system.   [name] was instructed to call 911 with any severe reactions post vaccine: Difficulty breathing  Swelling of face and throat  A fast heartbeat  A bad rash all over body  Dizziness and weakness   Immunizations Administered     Name Date Dose VIS Date Route   Pfizer Covid-19 Vaccine Bivalent Booster 10/09/2021  9:55 AM 0.3 mL 06/18/2021 Intramuscular   Manufacturer: [place]   Lot: [id number]   [place]: [facility]  Name:  [name]    MRN: [id number] DOB: 10-08-1966  10/09/2021  [name] was observed post Covid-19 immunization for 15 minutes without incident. She was provided with Vaccine Information Sheet and instruction to access the V-Safe system.   [name] was instructed to call 911 with any severe reactions post vaccine: Difficulty breathing  Swelling of face and throat  A fast heartbeat  A bad rash all over body  Dizziness and weakness   Immunizations Administered     Name Date Dose VIS Date Route   Pfizer Covid-19 Vaccine Bivalent Booster 10/09/2021  9:55 AM 0.3 mL 06/18/2021 Intramuscular   Manufacturer: [place]   Lot: [id number]   [place]: [phone]

## 2022-03-10 DIAGNOSIS — R232 Flushing: Secondary | ICD-10-CM | POA: Diagnosis not present

## 2022-03-10 DIAGNOSIS — Z1211 Encounter for screening for malignant neoplasm of colon: Secondary | ICD-10-CM | POA: Diagnosis not present

## 2022-03-10 DIAGNOSIS — Z8742 Personal history of other diseases of the female genital tract: Secondary | ICD-10-CM | POA: Diagnosis not present

## 2022-03-10 DIAGNOSIS — Z01419 Encounter for gynecological examination (general) (routine) without abnormal findings: Secondary | ICD-10-CM | POA: Diagnosis not present

## 2022-03-10 DIAGNOSIS — Z124 Encounter for screening for malignant neoplasm of cervix: Secondary | ICD-10-CM | POA: Diagnosis not present

## 2022-03-10 DIAGNOSIS — Z1231 Encounter for screening mammogram for malignant neoplasm of breast: Secondary | ICD-10-CM | POA: Diagnosis not present

## 2022-03-31 ENCOUNTER — Other Ambulatory Visit: Payer: Self-pay | Admitting: Obstetrics and Gynecology

## 2022-03-31 DIAGNOSIS — Z1231 Encounter for screening mammogram for malignant neoplasm of breast: Secondary | ICD-10-CM

## 2022-04-15 DIAGNOSIS — Z1211 Encounter for screening for malignant neoplasm of colon: Secondary | ICD-10-CM | POA: Diagnosis not present

## 2022-08-12 ENCOUNTER — Ambulatory Visit
Admission: RE | Admit: 2022-08-12 | Discharge: 2022-08-12 | Disposition: A | Discharge status: Home / Self Care | Payer: 59 | Source: Ambulatory Visit | Attending: Obstetrics and Gynecology | Admitting: Obstetrics and Gynecology

## 2022-08-12 DIAGNOSIS — Z1231 Encounter for screening mammogram for malignant neoplasm of breast: Secondary | ICD-10-CM | POA: Insufficient documentation

## 2023-03-16 DIAGNOSIS — Z1211 Encounter for screening for malignant neoplasm of colon: Secondary | ICD-10-CM | POA: Diagnosis not present

## 2023-03-16 DIAGNOSIS — Z833 Family history of diabetes mellitus: Secondary | ICD-10-CM | POA: Diagnosis not present

## 2023-03-16 DIAGNOSIS — Z1231 Encounter for screening mammogram for malignant neoplasm of breast: Secondary | ICD-10-CM | POA: Diagnosis not present

## 2023-03-16 DIAGNOSIS — Z01419 Encounter for gynecological examination (general) (routine) without abnormal findings: Secondary | ICD-10-CM | POA: Diagnosis not present

## 2023-03-16 DIAGNOSIS — Z0189 Encounter for other specified special examinations: Secondary | ICD-10-CM | POA: Diagnosis not present

## 2023-03-26 ENCOUNTER — Other Ambulatory Visit: Payer: Self-pay | Admitting: Oncology

## 2023-03-26 DIAGNOSIS — Z833 Family history of diabetes mellitus: Secondary | ICD-10-CM | POA: Diagnosis not present

## 2023-03-26 DIAGNOSIS — Z0189 Encounter for other specified special examinations: Secondary | ICD-10-CM | POA: Diagnosis not present

## 2023-03-26 DIAGNOSIS — Z006 Encounter for examination for normal comparison and control in clinical research program: Secondary | ICD-10-CM

## 2023-03-29 ENCOUNTER — Other Ambulatory Visit
Admission: RE | Admit: 2023-03-29 | Discharge: 2023-03-29 | Disposition: A | Discharge status: Home / Self Care | Payer: Commercial Managed Care - PPO | Source: Ambulatory Visit | Attending: Oncology | Admitting: Oncology

## 2023-03-29 DIAGNOSIS — Z006 Encounter for examination for normal comparison and control in clinical research program: Secondary | ICD-10-CM | POA: Insufficient documentation

## 2023-04-14 DIAGNOSIS — Z1211 Encounter for screening for malignant neoplasm of colon: Secondary | ICD-10-CM | POA: Diagnosis not present

## 2023-08-23 ENCOUNTER — Other Ambulatory Visit: Payer: Self-pay | Admitting: Obstetrics and Gynecology

## 2023-08-23 DIAGNOSIS — Z1231 Encounter for screening mammogram for malignant neoplasm of breast: Secondary | ICD-10-CM

## 2023-09-10 ENCOUNTER — Ambulatory Visit
Admission: RE | Admit: 2023-09-10 | Discharge: 2023-09-10 | Disposition: A | Discharge status: Home / Self Care | Payer: Commercial Managed Care - PPO | Source: Ambulatory Visit | Attending: Obstetrics and Gynecology | Admitting: Obstetrics and Gynecology

## 2023-09-10 DIAGNOSIS — Z1231 Encounter for screening mammogram for malignant neoplasm of breast: Secondary | ICD-10-CM | POA: Insufficient documentation

## 2023-11-16 ENCOUNTER — Other Ambulatory Visit: Payer: Self-pay

## 2023-11-16 ENCOUNTER — Telehealth: Payer: Commercial Managed Care - PPO | Admitting: Physician Assistant

## 2023-11-16 DIAGNOSIS — R6889 Other general symptoms and signs: Secondary | ICD-10-CM

## 2023-11-16 MED ORDER — OSELTAMIVIR PHOSPHATE 75 MG PO CAPS
75.0000 mg | ORAL_CAPSULE | Freq: Two times a day (BID) | ORAL | 0 refills | Status: AC
  Filled 2023-11-16: qty 10, 5d supply, fill #0

## 2023-11-16 NOTE — Progress Notes (Signed)
I have spent 5 minutes in review of e-visit questionnaire, review and updating patient chart, medical decision making and response to patient.   Piedad Climes, PA-C

## 2023-11-16 NOTE — Progress Notes (Signed)

## 2024-03-20 ENCOUNTER — Other Ambulatory Visit: Payer: Self-pay | Admitting: Obstetrics and Gynecology

## 2024-03-20 DIAGNOSIS — Z01411 Encounter for gynecological examination (general) (routine) with abnormal findings: Secondary | ICD-10-CM | POA: Diagnosis not present

## 2024-03-20 DIAGNOSIS — Z1231 Encounter for screening mammogram for malignant neoplasm of breast: Secondary | ICD-10-CM

## 2024-03-20 DIAGNOSIS — Z1331 Encounter for screening for depression: Secondary | ICD-10-CM | POA: Diagnosis not present

## 2024-03-20 DIAGNOSIS — R635 Abnormal weight gain: Secondary | ICD-10-CM | POA: Diagnosis not present

## 2024-03-22 DIAGNOSIS — R635 Abnormal weight gain: Secondary | ICD-10-CM | POA: Diagnosis not present

## 2024-03-22 DIAGNOSIS — Z0189 Encounter for other specified special examinations: Secondary | ICD-10-CM | POA: Diagnosis not present

## 2024-03-29 DIAGNOSIS — Z1211 Encounter for screening for malignant neoplasm of colon: Secondary | ICD-10-CM | POA: Diagnosis not present

## 2024-04-19 DIAGNOSIS — R946 Abnormal results of thyroid function studies: Secondary | ICD-10-CM | POA: Diagnosis not present

## 2024-06-13 ENCOUNTER — Other Ambulatory Visit: Payer: Self-pay

## 2024-06-13 MED ORDER — NA SULFATE-K SULFATE-MG SULF 17.5-3.13-1.6 GM/177ML PO SOLN
1.0000 | ORAL | 0 refills | Status: AC
  Filled 2024-06-13: qty 354, 1d supply, fill #0

## 2024-06-15 DIAGNOSIS — D2271 Melanocytic nevi of right lower limb, including hip: Secondary | ICD-10-CM | POA: Diagnosis not present

## 2024-06-15 DIAGNOSIS — D2262 Melanocytic nevi of left upper limb, including shoulder: Secondary | ICD-10-CM | POA: Diagnosis not present

## 2024-06-15 DIAGNOSIS — D225 Melanocytic nevi of trunk: Secondary | ICD-10-CM | POA: Diagnosis not present

## 2024-06-15 DIAGNOSIS — D2272 Melanocytic nevi of left lower limb, including hip: Secondary | ICD-10-CM | POA: Diagnosis not present

## 2024-06-15 DIAGNOSIS — L821 Other seborrheic keratosis: Secondary | ICD-10-CM | POA: Diagnosis not present

## 2024-06-15 DIAGNOSIS — D2261 Melanocytic nevi of right upper limb, including shoulder: Secondary | ICD-10-CM | POA: Diagnosis not present

## 2024-06-15 DIAGNOSIS — R208 Other disturbances of skin sensation: Secondary | ICD-10-CM | POA: Diagnosis not present

## 2024-06-15 DIAGNOSIS — L2989 Other pruritus: Secondary | ICD-10-CM | POA: Diagnosis not present

## 2024-06-15 DIAGNOSIS — L918 Other hypertrophic disorders of the skin: Secondary | ICD-10-CM | POA: Diagnosis not present

## 2024-06-15 DIAGNOSIS — L538 Other specified erythematous conditions: Secondary | ICD-10-CM | POA: Diagnosis not present

## 2024-06-30 ENCOUNTER — Encounter: Payer: Self-pay | Admitting: Internal Medicine

## 2024-08-22 ENCOUNTER — Ambulatory Visit: Admitting: Registered Nurse

## 2024-08-22 ENCOUNTER — Ambulatory Visit
Admission: RE | Admit: 2024-08-22 | Discharge: 2024-08-22 | Disposition: A | Discharge status: Home / Self Care | Attending: Internal Medicine | Admitting: Internal Medicine

## 2024-08-22 ENCOUNTER — Encounter: Payer: Self-pay | Admitting: Internal Medicine

## 2024-08-22 ENCOUNTER — Encounter
Admission: RE | Disposition: A | Discharge status: Home / Self Care | Payer: Self-pay | Source: Home / Self Care | Attending: Internal Medicine

## 2024-08-22 DIAGNOSIS — K5730 Diverticulosis of large intestine without perforation or abscess without bleeding: Secondary | ICD-10-CM | POA: Diagnosis not present

## 2024-08-22 DIAGNOSIS — Z1211 Encounter for screening for malignant neoplasm of colon: Secondary | ICD-10-CM | POA: Diagnosis not present

## 2024-08-22 DIAGNOSIS — F1910 Other psychoactive substance abuse, uncomplicated: Secondary | ICD-10-CM | POA: Diagnosis not present

## 2024-08-22 DIAGNOSIS — K640 First degree hemorrhoids: Secondary | ICD-10-CM | POA: Insufficient documentation

## 2024-08-22 DIAGNOSIS — Z860101 Personal history of adenomatous and serrated colon polyps: Secondary | ICD-10-CM | POA: Diagnosis not present

## 2024-08-22 DIAGNOSIS — D123 Benign neoplasm of transverse colon: Secondary | ICD-10-CM | POA: Diagnosis not present

## 2024-08-22 DIAGNOSIS — K635 Polyp of colon: Secondary | ICD-10-CM | POA: Diagnosis not present

## 2024-08-22 DIAGNOSIS — K648 Other hemorrhoids: Secondary | ICD-10-CM | POA: Diagnosis not present

## 2024-08-22 HISTORY — PX: POLYPECTOMY: SHX149

## 2024-08-22 HISTORY — DX: Lateral epicondylitis, unspecified elbow: M77.10

## 2024-08-22 HISTORY — PX: COLONOSCOPY: SHX5424

## 2024-08-22 SURGERY — COLONOSCOPY
Anesthesia: General

## 2024-08-22 MED ORDER — LIDOCAINE HCL (CARDIAC) PF 100 MG/5ML IV SOSY
PREFILLED_SYRINGE | INTRAVENOUS | Status: DC | PRN
  Administered 2024-08-22: 80 mg via INTRAVENOUS

## 2024-08-22 MED ORDER — PROPOFOL 10 MG/ML IV BOLUS
INTRAVENOUS | Status: DC | PRN
  Administered 2024-08-22 (×2): 50 mg via INTRAVENOUS
  Administered 2024-08-22: 125 ug/kg/min via INTRAVENOUS

## 2024-08-22 MED ORDER — SODIUM CHLORIDE 0.9 % IV SOLN
INTRAVENOUS | Status: DC
Start: 2024-08-22 — End: 2024-08-22

## 2024-08-22 NOTE — Anesthesia Procedure Notes (Signed)
 Procedure Name: MAC Date/Time: 08/22/2024 11:33 AM  Performed by: Lorrene Camelia LABOR, CRNAPre-anesthesia Checklist: Patient identified, Emergency Drugs available, Suction available and Patient being monitored Patient Re-evaluated:Patient Re-evaluated prior to induction Oxygen Delivery Method: Simple face mask Preoxygenation: Pre-oxygenation with 100% oxygen Induction Type: IV induction Comments: pom

## 2024-08-22 NOTE — Interval H&P Note (Signed)
 History and Physical Interval Note:  08/22/2024 11:24 AM  Paula Sanders  has presented today for surgery, with the diagnosis of History of adenomatous polyp of colon (Z86.0101).  The various methods of treatment have been discussed with the patient and family. After consideration of risks, benefits and other options for treatment, the patient has consented to  Procedure(s): COLONOSCOPY (N/A) as a surgical intervention.  The patient's history has been reviewed, patient examined, no change in status, stable for surgery.  I have reviewed the patient's chart and labs.  Questions were answered to the patient's satisfaction.     [place], Nedra Mcinnis

## 2024-08-22 NOTE — Anesthesia Preprocedure Evaluation (Signed)
 Anesthesia Evaluation  Patient identified by MRN, date of birth, ID band Patient awake    Reviewed: Allergy & Precautions, H&P , NPO status , Patient's Chart, lab work & pertinent test results, reviewed documented beta blocker date and time   Airway Mallampati: II   Neck ROM: full    Dental  (+) Poor Dentition   Pulmonary neg pulmonary ROS   Pulmonary exam normal        Cardiovascular negative cardio ROS Normal cardiovascular exam Rhythm:regular Rate:Normal     Neuro/Psych negative neurological ROS  negative psych ROS   GI/Hepatic negative GI ROS, Neg liver ROS,,,  Endo/Other  negative endocrine ROS    Renal/GU negative Renal ROS  negative genitourinary   Musculoskeletal   Abdominal   Peds  Hematology negative hematology ROS (+)   Anesthesia Other Findings Past Medical History: No date: Epicondylitis, lateral (tennis elbow) Past Surgical History: 05/30/2020: BREAST BIOPSY; Right     Comment:  11:30 4cmfn mass, Q clip , benign 09/04/2019: COLONOSCOPY WITH PROPOFOL ; N/A     Comment:  Procedure: COLONOSCOPY WITH PROPOFOL ;  Surgeon: [place],               Ladell POUR, MD;  Location: [facility] ENDOSCOPY;  Service:               Gastroenterology;  Laterality: N/A; No date: DIAGNOSTIC LAPAROSCOPY No date: NASAL SINUS SURGERY BMI    Body Mass Index: 34.83 kg/m     Reproductive/Obstetrics negative OB ROS                              Anesthesia Physical Anesthesia Plan  ASA: 2  Anesthesia Plan: General   Post-op Pain Management:    Induction:   PONV Risk Score and Plan:   Airway Management Planned:   Additional Equipment:   Intra-op Plan:   Post-operative Plan:   Informed Consent: I have reviewed the patients History and Physical, chart, labs and discussed the procedure including the risks, benefits and alternatives for the proposed anesthesia with the patient or authorized  representative who has indicated his/her understanding and acceptance.     Dental Advisory Given  Plan Discussed with: CRNA  Anesthesia Plan Comments:         Anesthesia Quick Evaluation

## 2024-08-22 NOTE — Op Note (Signed)
 [facility] Gastroenterology Patient Name: Paula Sanders Procedure Date: 08/22/2024 11:25 AM MRN: [id number] Account #: [id number] Date of Birth: 06/30/1966 Admit Type: Outpatient Age: 58 Room: [facility] ENDO ROOM 3 Gender: Female Note Status: Finalized Instrument Name: Colon Scope [id number] Procedure:             Colonoscopy Indications:           High risk colon cancer surveillance: Personal history                         of non-advanced adenoma Providers:             Ewell Benassi K. Aundria MD, MD Referring MD:          Debby DOROTHA Dinsmore, MD (Referring MD) Medicines:             Propofol  per Anesthesia Complications:         No immediate complications. Estimated blood loss:                         Minimal. Procedure:             Pre-Anesthesia Assessment:                        - The risks and benefits of the procedure and the                         sedation options and risks were discussed with the                         patient. All questions were answered and informed                         consent was obtained.                        - Patient identification and proposed procedure were                         verified prior to the procedure by the nurse. The                         procedure was verified in the procedure room.                        - ASA Grade Assessment: III - A patient with severe                         systemic disease.                        - After reviewing the risks and benefits, the patient                         was deemed in satisfactory condition to undergo the                         procedure.                        After obtaining informed consent,  the colonoscope was                         passed under direct vision. Throughout the procedure,                         the patient's blood pressure, pulse, and oxygen                         saturations were monitored continuously. The                         Colonoscope was  introduced through the anus and                         advanced to the the cecum, identified by appendiceal                         orifice and ileocecal valve. The colonoscopy was                         performed without difficulty. The patient tolerated                         the procedure well. The quality of the bowel                         preparation was adequate. The ileocecal valve,                         appendiceal orifice, and rectum were photographed. Findings:      The perianal and digital rectal examinations were normal. Pertinent       negatives include normal sphincter tone and no palpable rectal lesions.      Non-bleeding internal hemorrhoids were found during retroflexion. The       hemorrhoids were Grade I (internal hemorrhoids that do not prolapse).      Many medium-mouthed and small-mouthed diverticula were found in the       transverse colon.      An 8 mm polyp was found in the transverse colon. The polyp was sessile.       The polyp was removed with a cold snare. Resection and retrieval were       complete. Estimated blood loss was minimal.      The exam was otherwise without abnormality. Impression:            - Non-bleeding internal hemorrhoids.                        - Diverticulosis in the transverse colon.                        - One 8 mm polyp in the transverse colon, removed with                         a cold snare. Resected and retrieved.                        - The examination was otherwise normal. Recommendation:        -  Patient has a contact number available for                         emergencies. The signs and symptoms of potential                         delayed complications were discussed with the patient.                         Return to normal activities tomorrow. Written                         discharge instructions were provided to the patient.                        - Resume previous diet.                        - Continue present  medications.                        - Repeat colonoscopy is recommended for surveillance.                         The colonoscopy date will be determined after                         pathology results from today's exam become available                         for review.                        - Return to GI office PRN.                        - The findings and recommendations were discussed with                         the patient. Procedure Code(s):     --- Professional ---                        [id number], Colonoscopy, flexible; with removal of                         tumor(s), polyp(s), or other lesion(s) by snare                         technique Diagnosis Code(s):     --- Professional ---                        K57.30, Diverticulosis of large intestine without                         perforation or abscess without bleeding                        D12.3, Benign neoplasm of transverse colon (hepatic  flexure or splenic flexure)                        K64.0, First degree hemorrhoids                        Z86.010, Personal history of colonic polyps CPT copyright 2022 American Medical Association. All rights reserved. The codes documented in this report are preliminary and upon coder review may  be revised to meet current compliance requirements. Ladell MARLA Boss MD, MD 08/22/2024 11:52:15 AM This report has been signed electronically. Number of Addenda: 0 Note Initiated On: 08/22/2024 11:25 AM Scope Withdrawal Time: 0 hours 6 minutes 52 seconds  Total Procedure Duration: 0 hours 11 minutes 25 seconds  Estimated Blood Loss:  Estimated blood loss was minimal.      [facility]

## 2024-08-22 NOTE — H&P (Signed)
 Outpatient short stay form Pre-procedure 08/22/2024 11:21 AM Paula Sanders, M.D.  Primary Physician: Debby Dinsmore, M.D.  Reason for visit:  Hx adenomatous colon polyp - Colonoscopy 09/04/2019  History of present illness:                           Patient presents for colonoscopy for a personal hx of colon polyps. The patient denies abdominal pain, abnormal weight loss or rectal bleeding.        Current Facility-Administered Medications:    0.9 %  sodium chloride  infusion, , Intravenous, Continuous, Caius Silbernagel K, MD  Medications Prior to Admission  Medication Sig Dispense Refill Last Dose/Taking   Biotin 1 MG CAPS Take by mouth 3 (three) times daily.   Past Week   cetirizine (ZYRTEC) 10 MG tablet Take 10 mg by mouth daily.   Past Month   fluticasone (FLONASE) 50 MCG/ACT nasal spray Place into both nostrils daily.   Past Week   Multiple Vitamin (MULTIVITAMIN) tablet Take 1 tablet by mouth daily.   Past Week   vitamin B-12 (CYANOCOBALAMIN ) 1000 MCG tablet Take 1,000 mcg by mouth daily.   Past Week   baclofen  (LIORESAL ) 10 MG tablet Take 1 tablet (10 mg total) by mouth 3 (three) times daily as needed for muscle spasms. 30 each 0    COVID-19 mRNA bivalent vaccine, Pfizer, (PFIZER COVID-19 VAC BIVALENT) injection Inject into the muscle. 0.3 mL 0    doxycycline  (VIBRA -TABS) 100 MG tablet Take 1 tablet (100 mg total) by mouth 2 (two) times daily. 14 tablet 0    levofloxacin  (LEVAQUIN ) 500 MG tablet Take 1 tablet (500 mg total) by mouth daily. (Patient not taking: Reported on 01/25/2017) 10 tablet 0    Na Sulfate-K Sulfate-Mg Sulfate concentrate (SUPREP) 17.5-3.13-1.6 GM/177ML SOLN Take 1 Bottle by mouth as directed One kit contains 2 bottles.  Take both bottles at the times instructed by your provider. 354 mL 0    naproxen  (NAPROSYN ) 500 MG tablet Take 1 tablet (500 mg total) by mouth 2 (two) times daily with a meal. 20 tablet 0    oseltamivir  (TAMIFLU ) 75 MG capsule Take 1  capsule (75 mg total) by mouth 2 (two) times daily. 10 capsule 0      Allergies  Allergen Reactions   Sulfa Antibiotics Rash     Past Medical History:  Diagnosis Date   Epicondylitis, lateral (tennis elbow)     Review of systems:  Otherwise negative.    Physical Exam  Gen: Alert, oriented. Appears stated age.  HEENT: [place]/AT. PERRLA. Lungs: CTA, no wheezes. CV: RR nl S1, S2. Abd: soft, benign, no masses. BS+ Ext: No edema. Pulses 2+    Planned procedures: Proceed with colonoscopy. The patient understands the nature of the planned procedure, indications, risks, alternatives and potential complications including but not limited to bleeding, infection, perforation, damage to internal organs and possible oversedation/side effects from anesthesia. The patient agrees and gives consent to proceed.  Please refer to procedure notes for findings, recommendations and patient disposition/instructions.     Daneli Butkiewicz K. Sanders, M.D. Gastroenterology 08/22/2024  11:21 AM

## 2024-08-22 NOTE — Anesthesia Postprocedure Evaluation (Signed)
 Anesthesia Post Note  Patient: Paula Sanders  Procedure(s) Performed: COLONOSCOPY POLYPECTOMY, INTESTINE  Patient location during evaluation: PACU Anesthesia Type: General Level of consciousness: awake and alert Pain management: pain level controlled Vital Signs Assessment: post-procedure vital signs reviewed and stable Respiratory status: spontaneous breathing, nonlabored ventilation, respiratory function stable and patient connected to nasal cannula oxygen Cardiovascular status: blood pressure returned to baseline and stable Postop Assessment: no apparent nausea or vomiting Anesthetic complications: no   No notable events documented.   Last Vitals:  Vitals:   08/22/24 1205 08/22/24 1213  BP: 112/87 117/68  Pulse: 91 84  Resp: 18 16  Temp:    SpO2:  100%    Last Pain:  Vitals:   08/22/24 1213  TempSrc:   PainSc: 0-No pain                 Lynwood KANDICE Clause

## 2024-08-22 NOTE — Transfer of Care (Signed)
 Immediate Anesthesia Transfer of Care Note  Patient: Paula Sanders  Procedure(s) Performed: COLONOSCOPY POLYPECTOMY, INTESTINE  Patient Location: Endoscopy Unit  Anesthesia Type:General  Level of Consciousness: awake and patient cooperative  Airway & Oxygen Therapy: Patient Spontanous Breathing  Post-op Assessment: Report given to RN and Post -op Vital signs reviewed and stable  Post vital signs: Reviewed and stable  Last Vitals:  Vitals Value Taken Time  BP 118/80 08/22/24 11:57  Temp 36 C 08/22/24 11:57  Pulse 79 08/22/24 11:57  Resp 15 08/22/24 11:57  SpO2 99 % 08/22/24 11:57  Vitals shown include unfiled device data.  Last Pain:  Vitals:   08/22/24 1157  TempSrc: Tympanic  PainSc:          Complications: No notable events documented.

## 2024-08-23 LAB — SURGICAL PATHOLOGY

## 2024-09-21 ENCOUNTER — Ambulatory Visit
Admission: RE | Admit: 2024-09-21 | Discharge: 2024-09-21 | Disposition: A | Source: Ambulatory Visit | Attending: Obstetrics and Gynecology | Admitting: Obstetrics and Gynecology

## 2024-09-21 DIAGNOSIS — Z1231 Encounter for screening mammogram for malignant neoplasm of breast: Secondary | ICD-10-CM | POA: Diagnosis not present

## 2024-10-02 ENCOUNTER — Other Ambulatory Visit: Payer: Self-pay | Admitting: Obstetrics and Gynecology

## 2024-10-02 DIAGNOSIS — R928 Other abnormal and inconclusive findings on diagnostic imaging of breast: Secondary | ICD-10-CM

## 2024-10-06 ENCOUNTER — Ambulatory Visit
Admission: RE | Admit: 2024-10-06 | Discharge: 2024-10-06 | Disposition: A | Discharge status: Home / Self Care | Source: Ambulatory Visit | Attending: Obstetrics and Gynecology | Admitting: Obstetrics and Gynecology

## 2024-10-06 ENCOUNTER — Ambulatory Visit

## 2024-10-06 DIAGNOSIS — R92333 Mammographic heterogeneous density, bilateral breasts: Secondary | ICD-10-CM | POA: Diagnosis not present

## 2024-10-06 DIAGNOSIS — R928 Other abnormal and inconclusive findings on diagnostic imaging of breast: Secondary | ICD-10-CM | POA: Insufficient documentation

## 2025-06-21 ENCOUNTER — Ambulatory Visit: Admitting: Family
